# Patient Record
Sex: Female | Born: 1985 | Race: Black or African American | Hispanic: No | Marital: Married | Smoking: Former smoker
Health system: Southern US, Community
[De-identification: ages and names within clinical notes are randomized; demographics above are authoritative.]

## PROBLEM LIST (undated history)

## (undated) DIAGNOSIS — E119 Type 2 diabetes mellitus without complications: Secondary | ICD-10-CM

## (undated) HISTORY — PX: APPENDECTOMY: SHX54

## (undated) HISTORY — DX: Type 2 diabetes mellitus without complications: E11.9

---

## 2010-01-19 HISTORY — PX: ESSURE TUBAL LIGATION: SUR464

## 2020-11-11 ENCOUNTER — Other Ambulatory Visit (HOSPITAL_COMMUNITY)
Admission: RE | Admit: 2020-11-11 | Discharge: 2020-11-11 | Disposition: A | Discharge status: Home / Self Care | Payer: BC Managed Care – PPO | Source: Ambulatory Visit | Attending: Women's Health | Admitting: Women's Health

## 2020-11-11 ENCOUNTER — Encounter: Payer: Self-pay | Admitting: Women's Health

## 2020-11-11 ENCOUNTER — Ambulatory Visit (INDEPENDENT_AMBULATORY_CARE_PROVIDER_SITE_OTHER): Payer: BC Managed Care – PPO | Admitting: Women's Health

## 2020-11-11 ENCOUNTER — Other Ambulatory Visit: Payer: Self-pay

## 2020-11-11 VITALS — BP 123/85 | HR 85 | Ht 67.5 in | Wt 188.5 lb

## 2020-11-11 DIAGNOSIS — R1031 Right lower quadrant pain: Secondary | ICD-10-CM

## 2020-11-11 DIAGNOSIS — Z124 Encounter for screening for malignant neoplasm of cervix: Secondary | ICD-10-CM | POA: Insufficient documentation

## 2020-11-11 DIAGNOSIS — N941 Unspecified dyspareunia: Secondary | ICD-10-CM

## 2020-11-11 DIAGNOSIS — Z3202 Encounter for pregnancy test, result negative: Secondary | ICD-10-CM | POA: Diagnosis not present

## 2020-11-11 DIAGNOSIS — N946 Dysmenorrhea, unspecified: Secondary | ICD-10-CM

## 2020-11-11 DIAGNOSIS — G8929 Other chronic pain: Secondary | ICD-10-CM

## 2020-11-11 LAB — POCT URINE PREGNANCY: Preg Test, Ur: NEGATIVE

## 2020-11-11 NOTE — Progress Notes (Signed)
GYN VISIT Patient name: Jenny Wilson MRN [id number]  Date of birth: 19-Apr-1985 Chief Complaint:   having pain in both sides (Pain is getting worse)  History of Present Illness:   Jenny Wilson is a 34 y.o. [phone] African-American female being seen today as a new pt for daily pinching pain RLQ x many years, worse w/ period & sex.  States was told she needed surgery in past d/t ovarian cysts, given pills to stop periods prior to surgery and cysts resolved so didn't need surgery. States no one will do anything about her pain d/t her high A1C, but has been working hard on losing weight, getting diabetes under control. When asked about abnormal d/c, itching/odor/irritation, she says 'yes all of that'. Has Essure, wonders if pain has something to do with that.  Patient's last menstrual period was 09/23/2020. The current method of family planning is  Essure .  Last pap she thought last year- pulled up in [facility]- was 10/28/2017. Results were: NILM w/ HRHPV negative  Depression screen PHQ 2/9 11/11/2020  Decreased Interest 2  Down, Depressed, Hopeless 2  PHQ - 2 Score 4  Altered sleeping 2  Tired, decreased energy 2  Change in appetite 2  Feeling bad or failure about yourself  2  Trouble concentrating 2  Moving slowly or fidgety/restless 0  Suicidal thoughts 0  PHQ-9 Score 14     GAD 7 : Generalized Anxiety Score 11/11/2020  Nervous, Anxious, on Edge 3  Control/stop worrying 2  Worry too much - different things 3  Trouble relaxing 3  Restless 3  Easily annoyed or irritable 1  Afraid - awful might happen 1  Total GAD 7 Score 16     Review of Systems:   Pertinent items are noted in HPI Denies fever/chills, dizziness, headaches, visual disturbances, fatigue, shortness of breath, chest pain, abdominal pain, vomiting, abnormal vaginal discharge/itching/odor/irritation, problems with periods, bowel movements, urination, or intercourse unless otherwise stated above.  Pertinent  History Reviewed:  Reviewed past medical,surgical, social, obstetrical and family history.  Reviewed problem list, medications and allergies. Physical Assessment:   Vitals:   11/11/20 1434  BP: 123/85  Pulse: 85  Weight: 188 lb 8 oz (85.5 kg)  Height: 5' 7.5" (1.715 m)  Body mass index is 29.09 kg/m.       Physical Examination:   General appearance: alert, well appearing, and in no distress  Mental status: alert, oriented to person, place, and time  Skin: warm & dry   Cardiovascular: normal heart rate noted  Respiratory: normal respiratory effort, no distress  Abdomen: soft, non-tender   Pelvic: VULVA: normal appearing vulva with no masses, tenderness or lesions, VAGINA: normal appearing vagina with normal color and discharge, no lesions, CERVIX: normal appearing cervix without discharge or lesions, UTERUS: uterus is normal size, shape, consistency and nontender, ADNEXA: normal adnexa in size, nontender and no masses  Extremities: no edema   Chaperone: Malachy Mood    Results for orders placed or performed in visit on 11/11/20 (from the past 24 hour(s))  POCT urine pregnancy   Collection Time: 11/11/20  3:04 PM  Result Value Ref Range   Preg Test, Ur Negative Negative    Assessment & Plan:  1) Chronic RLQ pinching pain, dyspareunia, dysmenorrhea> has Essure, will check STD screen from pap, get pelvic u/s at [facility] and f/u w/ MD-note routed to [facility] to schedule and call pt  2) Screening for cervical cancer> pap today  Meds: No orders of the  defined types were placed in this encounter.   Orders Placed This Encounter  Procedures   US PELVIS (TRANSABDOMINAL ONLY)   US PELVIS TRANSVAGINAL NON-OB (TV ONLY)   POCT urine pregnancy    Return for gyn u/s at [facility] and f/u w/ MD only.  Cheral Marker CNM, [facility] 11/11/2020 4:56 PM

## 2020-11-14 LAB — CYTOLOGY - PAP
Chlamydia: NEGATIVE
Comment: NEGATIVE
Comment: NEGATIVE
Comment: NORMAL
Diagnosis: NEGATIVE
High risk HPV: NEGATIVE
Neisseria Gonorrhea: NEGATIVE

## 2020-11-20 ENCOUNTER — Other Ambulatory Visit: Payer: Self-pay | Admitting: Women's Health

## 2020-11-20 ENCOUNTER — Other Ambulatory Visit: Payer: Self-pay

## 2020-11-20 ENCOUNTER — Ambulatory Visit (HOSPITAL_COMMUNITY)
Admission: RE | Admit: 2020-11-20 | Discharge: 2020-11-20 | Disposition: A | Discharge status: Home / Self Care | Payer: BC Managed Care – PPO | Source: Ambulatory Visit | Attending: Women's Health | Admitting: Women's Health

## 2020-11-20 DIAGNOSIS — R1031 Right lower quadrant pain: Secondary | ICD-10-CM

## 2020-11-20 DIAGNOSIS — G8929 Other chronic pain: Secondary | ICD-10-CM | POA: Diagnosis present

## 2020-11-20 DIAGNOSIS — N941 Unspecified dyspareunia: Secondary | ICD-10-CM | POA: Insufficient documentation

## 2020-11-20 DIAGNOSIS — N946 Dysmenorrhea, unspecified: Secondary | ICD-10-CM | POA: Diagnosis present

## 2020-11-22 ENCOUNTER — Ambulatory Visit: Payer: BC Managed Care – PPO | Admitting: Obstetrics & Gynecology

## 2020-11-25 ENCOUNTER — Telehealth: Payer: Self-pay

## 2020-11-25 NOTE — Telephone Encounter (Signed)
-----   Message from Cheral Marker, [place] sent at 11/25/2020  1:38 PM EST ----- Please let her know u/s normal, had appt w/ Dr. On 11/4 but looks like she didn't keep. Please reschedule her w/ MD only if she wants to f/u. Thanks

## 2020-11-25 NOTE — Telephone Encounter (Signed)
Called pt to relay [place] results per Joellyn Haff. Two identifiers used. Pt confirmed understanding and does not wish to f/u at this time.

## 2021-02-10 ENCOUNTER — Encounter: Payer: Self-pay | Admitting: Emergency Medicine

## 2021-02-10 ENCOUNTER — Ambulatory Visit
Admission: EM | Admit: 2021-02-10 | Discharge: 2021-02-10 | Disposition: A | Discharge status: Home / Self Care | Payer: BC Managed Care – PPO | Attending: Family Medicine | Admitting: Family Medicine

## 2021-02-10 ENCOUNTER — Other Ambulatory Visit: Payer: Self-pay

## 2021-02-10 DIAGNOSIS — R6883 Chills (without fever): Secondary | ICD-10-CM | POA: Diagnosis not present

## 2021-02-10 DIAGNOSIS — R110 Nausea: Secondary | ICD-10-CM

## 2021-02-10 DIAGNOSIS — R197 Diarrhea, unspecified: Secondary | ICD-10-CM | POA: Diagnosis not present

## 2021-02-10 DIAGNOSIS — Z20822 Contact with and (suspected) exposure to covid-19: Secondary | ICD-10-CM | POA: Diagnosis not present

## 2021-02-10 DIAGNOSIS — R739 Hyperglycemia, unspecified: Secondary | ICD-10-CM

## 2021-02-10 DIAGNOSIS — Z8639 Personal history of other endocrine, nutritional and metabolic disease: Secondary | ICD-10-CM

## 2021-02-10 LAB — POCT FASTING CBG KUC MANUAL ENTRY: POCT Glucose (KUC): 240 mg/dL — AB (ref 70–99)

## 2021-02-10 MED ORDER — ONDANSETRON 4 MG PO TBDP: 4.0000 mg | ORAL_TABLET | Freq: Three times a day (TID) | ORAL | 0 refills | Status: AC | PRN

## 2021-02-10 MED ORDER — LOPERAMIDE HCL 2 MG PO CAPS: 2.0000 mg | ORAL_CAPSULE | Freq: Four times a day (QID) | ORAL | 0 refills | Status: AC | PRN

## 2021-02-10 NOTE — ED Provider Notes (Signed)
[facility]    CSN: [id number] Arrival date & time: 02/10/21  1824      History   Chief Complaint Chief Complaint  Patient presents with   Chills   Headache    HPI Jenny Wilson is a 36 y.o. female.   Presenting today with 1 day history of chills, headache, nausea, diarrhea, dizziness and lightheadedness, fatigue.  Denies cough, sore throat, congestion, vomiting, known fever.  So far taking Sudafed with no relief.  Nephew who lives with her currently has COVID.  Of note, history of insulin-dependent diabetes not currently checking blood sugars as she ran out of equipment to do so.  She has been compliant with her insulin therapy but states her sugars are "all over the place ".  She has not eaten anything today but did take her medicines.   Past Medical History:  Diagnosis Date   Diabetes mellitus without complication (HCC)     There are no problems to display for this patient.   Past Surgical History:  Procedure Laterality Date   APPENDECTOMY     ESSURE TUBAL LIGATION  2012    OB History     Gravida  5   Para  3   Term  3   Preterm      AB  2   Living  3      SAB  2   IAB      Ectopic      Multiple      Live Births  3            Home Medications    Prior to Admission medications   Medication Sig Start Date End Date Taking? Authorizing Provider  insulin glargine (LANTUS SOLOSTAR) 100 UNIT/ML Solostar Pen Inject 45 Units into the skin at bedtime. 06/19/20 06/19/21 Yes [provider]  insulin lispro (HUMALOG) 100 UNIT/ML KwikPen 15 units with meals plus correction scale. Max TDD 75 units 06/19/20  Yes [provider]  Insulin Pen Needle (BD PEN NEEDLE NANO U/F) 32G X 4 MM MISC Use to give insulin up to four times a day. 06/19/20  Yes [provider]  loperamide (IMODIUM) 2 MG capsule Take 1 capsule (2 mg total) by mouth 4 (four) times daily as needed for diarrhea or loose stools. 02/10/21  Yes Particia NearingLane, Cortana Vanderford  [place], PA-C  ondansetron (ZOFRAN-ODT) 4 MG disintegrating tablet Take 1 tablet (4 mg total) by mouth every 8 (eight) hours as needed for nausea or vomiting. 02/10/21  Yes Particia NearingLane, [place] Elizabeth, PA-C  metFORMIN (GLUCOPHAGE) 500 MG tablet Take by mouth. Patient not taking: Reported on 11/11/2020 10/16/19   [provider]  Multiple Vitamin (MULTIVITAMIN) tablet Take 1 tablet by mouth daily.    [provider]  OMEPRAZOLE PO Take by mouth.    [provider]    Family History Family History  Problem Relation Age of Onset   Alcoholism Maternal Grandfather    Healthy Sister    Hypertension Daughter    Diabetes Daughter    Diabetes Daughter        pre-diabetes   Diabetes Son        pre-diabetes    Social History Social History   Tobacco Use   Smoking status: Former    Types: Cigars   Smokeless tobacco: Never  Vaping Use   Vaping Use: Former  Substance Use Topics   Alcohol use: Never   Drug use: Never     Allergies   Hydrocodone and  Oxycodone-acetaminophen   Review of Systems Review of Systems Per HPI  Physical Exam Triage Vital Signs ED Triage Vitals  Enc Vitals Group     BP 02/10/21 1831 123/84     Pulse Rate 02/10/21 1831 84     Resp 02/10/21 1831 16     Temp 02/10/21 1831 98.1 F (36.7 C)     Temp Source 02/10/21 1831 Oral     SpO2 02/10/21 1831 98 %     Weight --      Height --      Head Circumference --      Peak Flow --      Pain Score 02/10/21 1833 2     Pain Loc --      Pain Edu? --      Excl. in GC? --    No data found.  Updated Vital Signs BP 123/84 (BP Location: Right Arm)    Pulse 84    Temp 98.1 F (36.7 C) (Oral)    Resp 16    LMP 02/07/2021 (Exact Date)    SpO2 98%   Visual Acuity Right Eye Distance:   Left Eye Distance:   Bilateral Distance:    Right Eye Near:   Left Eye Near:    Bilateral Near:     Physical Exam Vitals and nursing note reviewed.  Constitutional:      Appearance: Normal  appearance. She is not ill-appearing.  HENT:     Head: Atraumatic.     Nose: Nose normal.     Mouth/Throat:     Mouth: Mucous membranes are moist.     Pharynx: Oropharynx is clear.  Eyes:     Extraocular Movements: Extraocular movements intact.     Conjunctiva/sclera: Conjunctivae normal.  Cardiovascular:     Rate and Rhythm: Normal rate and regular rhythm.     Heart sounds: Normal heart sounds.  Pulmonary:     Effort: Pulmonary effort is normal.     Breath sounds: Normal breath sounds.  Abdominal:     General: Bowel sounds are normal. There is no distension.     Palpations: Abdomen is soft.     Tenderness: There is no abdominal tenderness. There is no guarding.  Musculoskeletal:        General: Normal range of motion.     Cervical back: Normal range of motion and neck supple.  Skin:    General: Skin is warm and dry.  Neurological:     Mental Status: She is alert and oriented to person, place, and time.     Motor: No weakness.     Gait: Gait normal.  Psychiatric:        Mood and Affect: Mood normal.        Thought Content: Thought content normal.        Judgment: Judgment normal.     UC Treatments / Results  Labs (all labs ordered are listed, but only abnormal results are displayed) Labs Reviewed  POCT FASTING CBG [facility] MANUAL ENTRY - Abnormal; Notable for the following components:      Result Value   POCT Glucose ([facility]) 240 (*)    All other components within normal limits  NOVEL CORONAVIRUS, NAA    EKG   Radiology No results found.  Procedures Procedures (including critical care time)  Medications Ordered in UC Medications - No data to display  Initial Impression / Assessment and Plan / UC Course  I have reviewed the triage vital signs and the nursing  notes.  Pertinent labs & imaging results that were available during my care of the patient were reviewed by me and considered in my medical decision making (see chart for details).     Vital signs and exam  overall very reassuring today, point-of-care glucose 240.  Discussed monitoring at home as soon as possible so she can titrate her insulin accordingly, particularly while sick and appetite is less than usual.  We will treat symptomatically with Zofran, Imodium, brat diet, fluids and await COVID and flu testing.  Do suspect COVID especially given her home exposure.  Return for any acutely worsening symptoms.  Final Clinical Impressions(s) / UC Diagnoses   Final diagnoses:  Exposure to COVID-19 virus  Nausea without vomiting  Diarrhea, unspecified type  Chills  H/O insulin dependent diabetes mellitus  Hyperglycemia   Discharge Instructions   None    ED Prescriptions     Medication Sig Dispense Auth. Provider   ondansetron (ZOFRAN-ODT) 4 MG disintegrating tablet Take 1 tablet (4 mg total) by mouth every 8 (eight) hours as needed for nausea or vomiting. 20 tablet Particia Nearing, [place]   loperamide (IMODIUM) 2 MG capsule Take 1 capsule (2 mg total) by mouth 4 (four) times daily as needed for diarrhea or loose stools. 12 capsule Particia Nearing, [place]      PDMP not reviewed this encounter.   Particia Nearing, [place] 02/10/21 1913

## 2021-02-10 NOTE — ED Triage Notes (Signed)
Patient c/o chills and headache x 1 day.   Patient endorses nausea. Patient endorses dizziness.   Patient endorses recent COVID exposure.   Patient has taken Sudafed with no relief of symptoms.

## 2021-02-11 LAB — NOVEL CORONAVIRUS, NAA: SARS-CoV-2, NAA: NOT DETECTED

## 2021-02-11 LAB — SARS-COV-2, NAA 2 DAY TAT

## 2022-11-03 IMAGING — US US PELVIS COMPLETE WITH TRANSVAGINAL
1 series · 14 of 25 positions shown · non-contrast
Comparison: None

CLINICAL DATA: Chronic right lower quadrant pain with menses

EXAM:
TRANSABDOMINAL AND TRANSVAGINAL ULTRASOUND OF PELVIS
TECHNIQUE: Both transabdominal and transvaginal ultrasound examinations of the
pelvis were performed. Transabdominal technique was performed for
global imaging of the pelvis including uterus, ovaries, adnexal
regions, and pelvic cul-de-sac. It was necessary to proceed with
endovaginal exam following the transabdominal exam to visualize the
uterus and adnexa.

[Series 1: us pelvic complete with transvaginal · 14 of 57 slices shown]
[im 1/57]
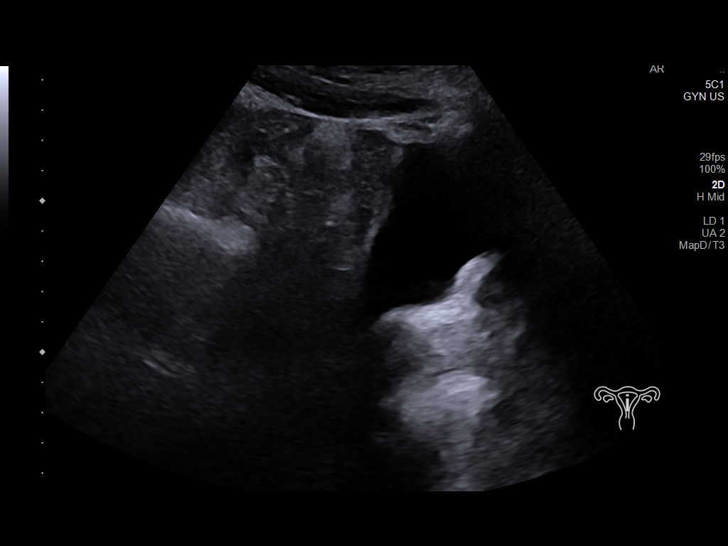
[im 5/57]
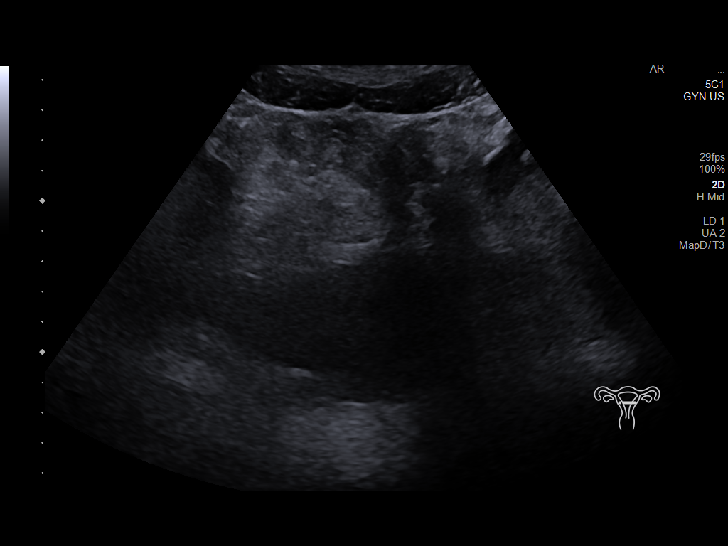
[im 10/57]
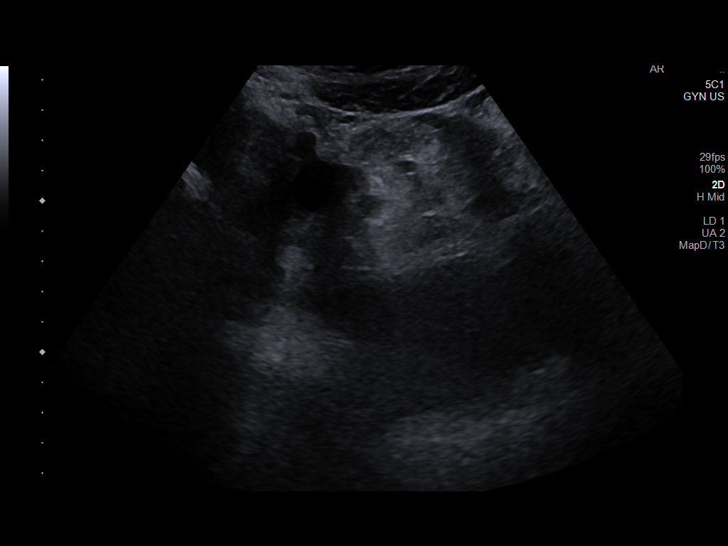
[im 15/57]
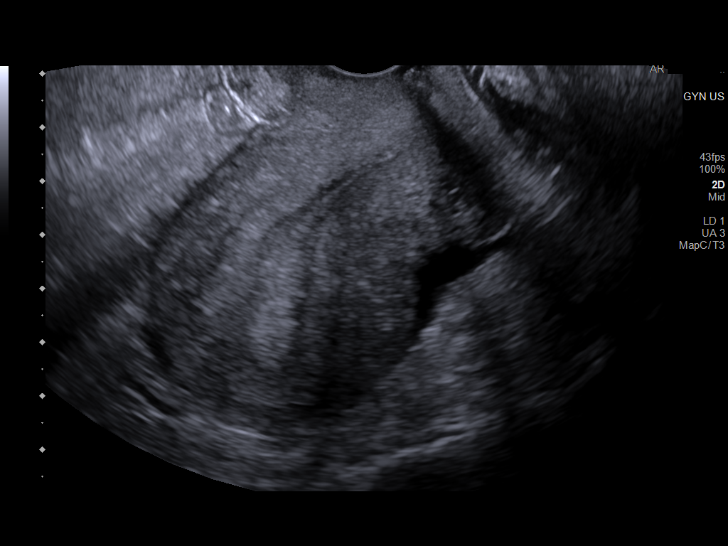
[im 19/57]
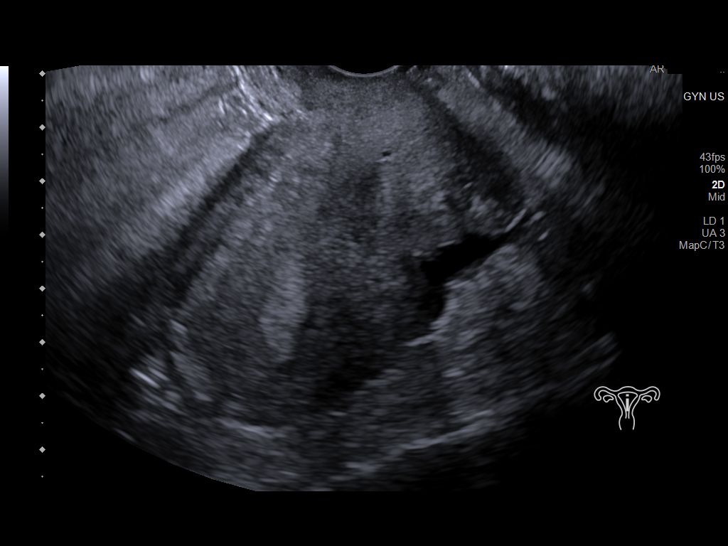
[im 22/57]
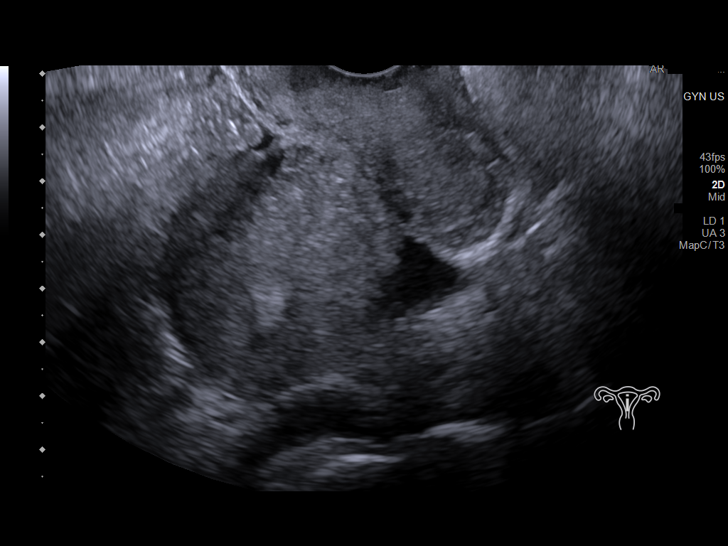
[im 26/57]
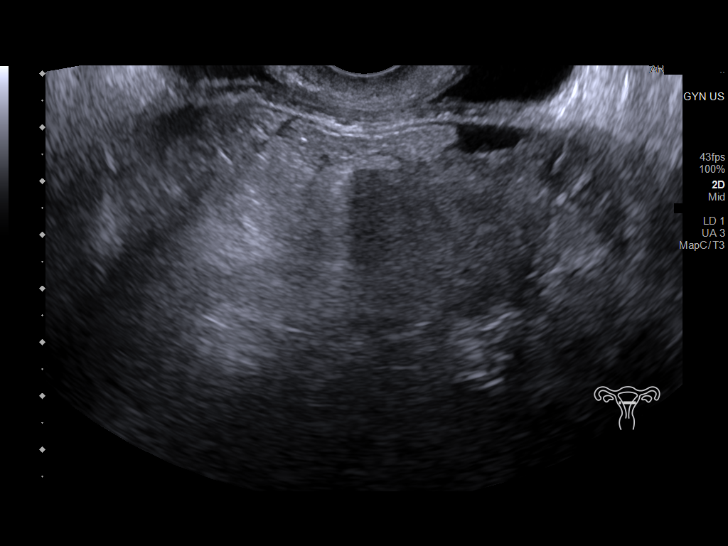
[im 31/57]
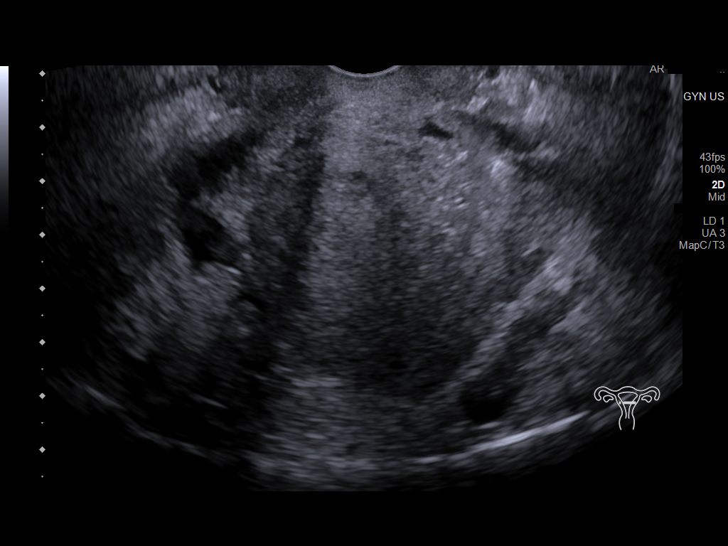
[im 36/57]
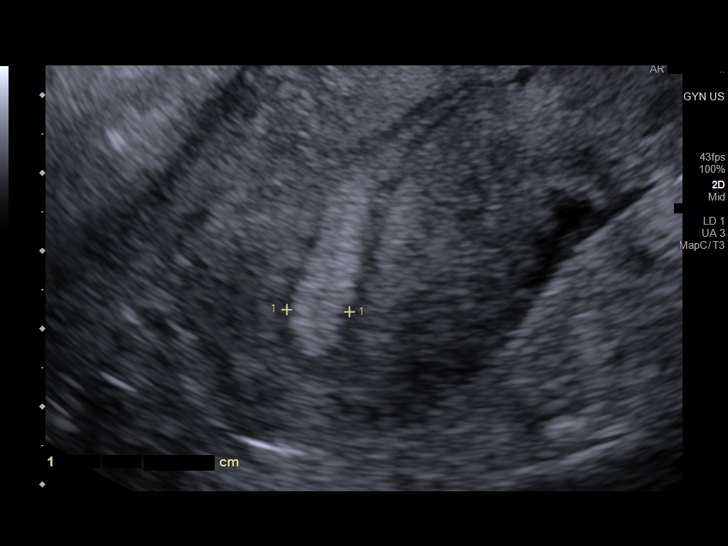
[im 38/57]
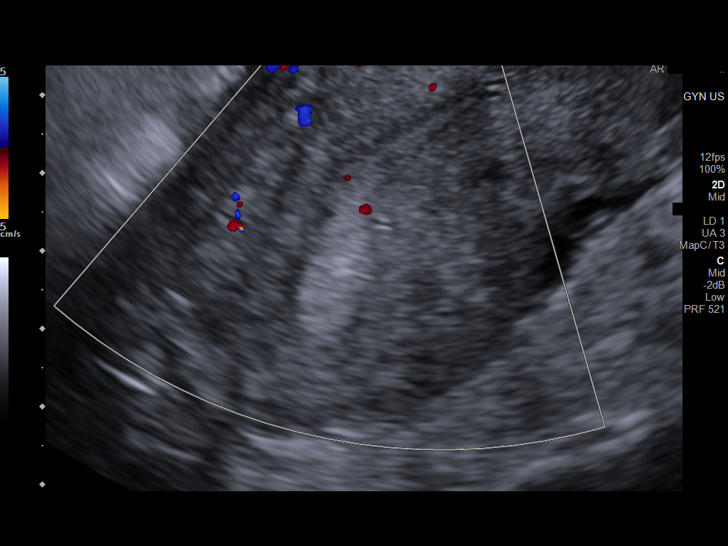
[im 43/57]
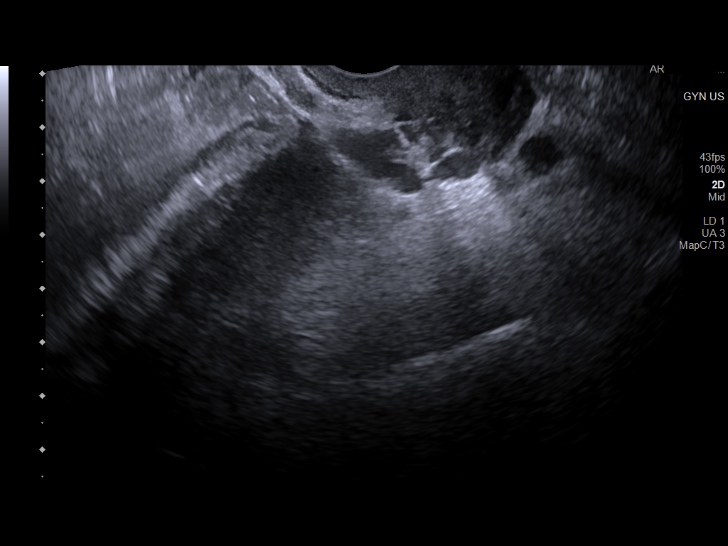
[im 47/57]
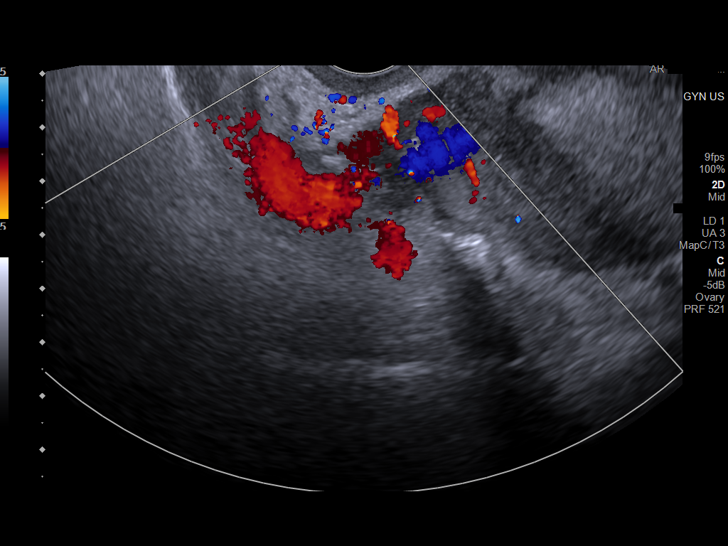
[im 52/57]
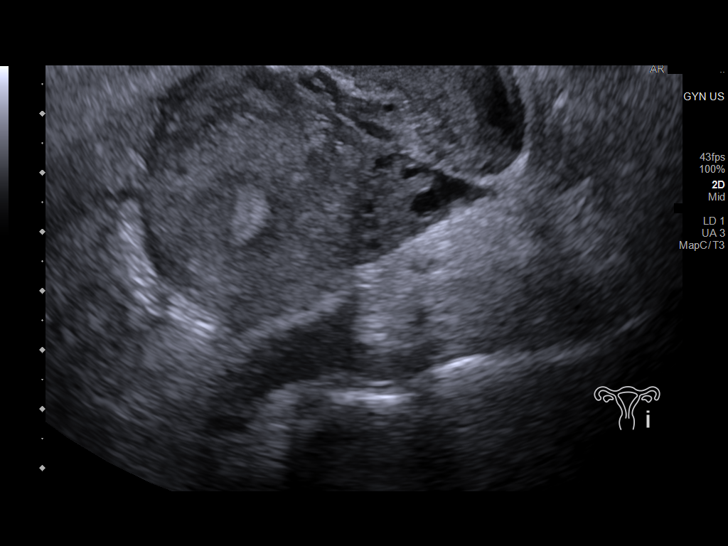
[im 57/57]
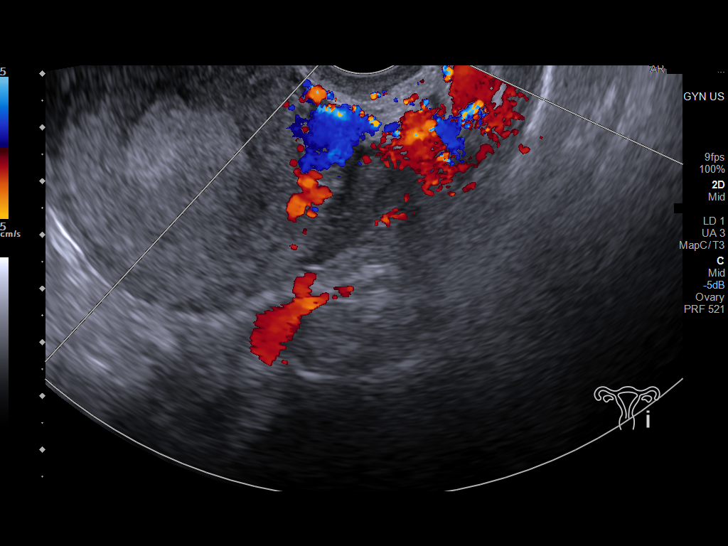

[14 of 25 positions shown; findings below may reference images not displayed]

FINDINGS: Uterus

Measurements: 8.3 x 4.9 x 6.3 cm = volume: 134 mL. No fibroids or
other mass visualized.

Endometrium

Thickness: 8 mm.  No focal abnormality visualized.

Right ovary

Measurements: 2.8 x 1.6 x 2.1 cm = volume: 5.0 mL. Normal
appearance/no adnexal mass.

Left ovary

Measurements: 2.2 x 1.5 x 1.6 cm = volume: 2.6 mL. Normal
appearance/no adnexal mass.

Other findings

Minimal free pelvic fluid is likely physiologic.
IMPRESSION: Normal pelvic ultrasound.
